# Patient Record
Sex: Female | Born: 1980 | Race: White | Hispanic: No | Marital: Married | Smoking: Never smoker
Health system: Southern US, Community
[De-identification: ages and names within clinical notes are randomized; demographics above are authoritative.]

## PROBLEM LIST (undated history)

## (undated) DIAGNOSIS — A159 Respiratory tuberculosis unspecified: Secondary | ICD-10-CM

## (undated) HISTORY — DX: Respiratory tuberculosis unspecified: A15.9

---

## 2001-02-20 ENCOUNTER — Other Ambulatory Visit: Admission: RE | Admit: 2001-02-20 | Discharge: 2001-02-20 | Payer: Self-pay | Admitting: Family Medicine

## 2005-07-22 ENCOUNTER — Other Ambulatory Visit: Admission: RE | Admit: 2005-07-22 | Discharge: 2005-07-22 | Payer: Self-pay | Admitting: Gynecology

## 2008-07-04 ENCOUNTER — Encounter: Admission: RE | Admit: 2008-07-04 | Discharge: 2008-07-04 | Payer: Self-pay | Admitting: Pulmonary Disease

## 2012-11-08 ENCOUNTER — Emergency Department (HOSPITAL_COMMUNITY): Payer: BC Managed Care – PPO

## 2012-11-08 ENCOUNTER — Emergency Department (HOSPITAL_COMMUNITY)
Admission: EM | Admit: 2012-11-08 | Discharge: 2012-11-08 | Disposition: A | Discharge status: Home / Self Care | Payer: BC Managed Care – PPO | Attending: Emergency Medicine | Admitting: Emergency Medicine

## 2012-11-08 ENCOUNTER — Encounter (HOSPITAL_COMMUNITY): Payer: Self-pay | Admitting: *Deleted

## 2012-11-08 DIAGNOSIS — R0789 Other chest pain: Secondary | ICD-10-CM

## 2012-11-08 DIAGNOSIS — R0602 Shortness of breath: Secondary | ICD-10-CM | POA: Insufficient documentation

## 2012-11-08 LAB — BASIC METABOLIC PANEL
BUN: 11 mg/dL (ref 6–23)
Calcium: 9.4 mg/dL (ref 8.4–10.5)
GFR calc Af Amer: >90 mL/min (ref >90)
GFR calc non Af Amer: >90 mL/min (ref >90)
Glucose, Bld: 91 mg/dL (ref 70–99)
Potassium: 3.9 mEq/L (ref 3.5–5.1)
Sodium: 135 mEq/L (ref 135–145)

## 2012-11-08 LAB — CBC WITH DIFFERENTIAL/PLATELET
Basophils Absolute: 0 10*3/uL (ref 0.0–0.1)
Basophils Relative: 1 % (ref 0–1)
Eosinophils Absolute: 0.1 10*3/uL (ref 0.0–0.7)
Hemoglobin: 13.1 g/dL (ref 12.0–15.0)
MCH: 32.6 pg (ref 26.0–34.0)
MCHC: 35.4 g/dL (ref 30.0–36.0)
Monocytes Relative: 6 % (ref 3–12)
Neutro Abs: 4.1 10*3/uL (ref 1.7–7.7)
Neutrophils Relative %: 64 % (ref 43–77)
Platelets: 226 10*3/uL (ref 150–400)

## 2012-11-08 MED ORDER — ALBUTEROL SULFATE HFA 108 (90 BASE) MCG/ACT IN AERS
2.0000 | INHALATION_SPRAY | RESPIRATORY_TRACT | Status: DC | PRN
  Administered 2012-11-08: 2 via RESPIRATORY_TRACT
  Filled 2012-11-08: qty 6.7

## 2012-11-08 NOTE — ED Notes (Signed)
Pt states she has been having difficulty breathing for the past week, states she "has to think about breathing". Today she developed left sided chest tightness, numbness/tingling radiates to left arm. Pain intermittent, pt states she is not in any pain right now

## 2012-11-08 NOTE — ED Provider Notes (Signed)
CSN: [id number]     Arrival date & time 11/08/12  1658 History   First MD Initiated Contact with Patient 11/08/12 1712     Chief Complaint  Patient presents with  . Shortness of Breath  . Chest Pain   (Consider location/radiation/quality/duration/timing/severity/associated sxs/prior Treatment) Patient is a 32 y.o. female presenting with shortness of breath and chest pain. The history is provided by the patient. No language interpreter was used.  Shortness of Breath Severity:  Mild Associated symptoms: chest pain   Associated symptoms: no abdominal pain, no cough, no fever and no neck pain   Associated symptoms comment:  Chest tightness, "not pain", for the past week. It began as intermittent tightness without aggravating or alleviating factors, and has progressed to become more constant today and yesterday. The pain is located in the left chest and affects the left upper arm. She states SOB described as "I have to remind myself to take a breath" and this is also unchanged with exertion. No nausea, cough, fever. She denies any long periods of travel recently, oral estrogen and she is a non-smoker. Family history negative for heart disease. Chest Pain Associated symptoms: shortness of breath   Associated symptoms: no abdominal pain, no cough, no fever, no nausea, no palpitations and no weakness     History reviewed. No pertinent past medical history. No past surgical history on file. No family history on file. History  Substance Use Topics  . Smoking status: Not on file  . Smokeless tobacco: Not on file  . Alcohol Use: Not on file   OB History   Grav Para Term Preterm Abortions TAB SAB Ect Mult Living                 Review of Systems  Constitutional: Negative for fever.  HENT: Negative for neck pain.   Respiratory: Positive for shortness of breath. Negative for cough.   Cardiovascular: Positive for chest pain. Negative for palpitations and leg swelling.  Gastrointestinal:  Negative for nausea and abdominal pain.  Genitourinary: Negative for dysuria and menstrual problem.  Musculoskeletal: Negative for myalgias.  Skin: Negative for color change and pallor.  Neurological: Negative for weakness.  Psychiatric/Behavioral: Negative for confusion.    Allergies  Review of patient's allergies indicates no known allergies.  Home Medications  No current outpatient prescriptions on file. BP 111/60  Pulse 68  Temp(Src) 98.1 F (36.7 C) (Oral)  Resp 16  SpO2 100% Physical Exam  Constitutional: She is oriented to person, place, and time. She appears well-developed and well-nourished.  HENT:  Head: Normocephalic.  Neck: Normal range of motion. Neck supple.  Cardiovascular: Normal rate and regular rhythm.   No murmur heard. Pulmonary/Chest: Effort normal and breath sounds normal. She has no wheezes. She has no rales. She exhibits no tenderness.  Abdominal: Soft. Bowel sounds are normal. There is no tenderness. There is no rebound and no guarding.  Musculoskeletal: Normal range of motion. She exhibits no edema.  Neurological: She is alert and oriented to person, place, and time.  Skin: Skin is warm and dry. No rash noted.  Psychiatric: She has a normal mood and affect.    ED Course  Procedures (including critical care time) Labs Review Labs Reviewed  TROPONIN I  BASIC METABOLIC PANEL  CBC WITH DIFFERENTIAL   Results for orders placed during the hospital encounter of 11/08/12  TROPONIN I      Result Value Range   Troponin I <0.30  <0.30 ng/mL  BASIC METABOLIC PANEL  Result Value Range   Sodium 135  135 - 145 mEq/L   Potassium 3.9  3.5 - 5.1 mEq/L   Chloride 104  96 - 112 mEq/L   CO2 24  19 - 32 mEq/L   Glucose, Bld 91  70 - 99 mg/dL   BUN 11  6 - 23 mg/dL   Creatinine, Ser 1.30  0.50 - 1.10 mg/dL   Calcium 9.4  8.4 - 86.5 mg/dL   GFR calc non Af Amer >90  >90 mL/min   GFR calc Af Amer >90  >90 mL/min  CBC WITH DIFFERENTIAL      Result Value  Range   WBC 6.4  4.0 - 10.5 K/uL   RBC 4.02  3.87 - 5.11 MIL/uL   Hemoglobin 13.1  12.0 - 15.0 g/dL   HCT 78.4  69.6 - 29.5 %   MCV 92.0  78.0 - 100.0 fL   MCH 32.6  26.0 - 34.0 pg   MCHC 35.4  30.0 - 36.0 g/dL   RDW 28.4  13.2 - 44.0 %   Platelets 226  150 - 400 K/uL   Neutrophils Relative % 64  43 - 77 %   Neutro Abs 4.1  1.7 - 7.7 K/uL   Lymphocytes Relative 28  12 - 46 %   Lymphs Abs 1.8  0.7 - 4.0 K/uL   Monocytes Relative 6  3 - 12 %   Monocytes Absolute 0.4  0.1 - 1.0 K/uL   Eosinophils Relative 1  0 - 5 %   Eosinophils Absolute 0.1  0.0 - 0.7 K/uL   Basophils Relative 1  0 - 1 %   Basophils Absolute 0.0  0.0 - 0.1 K/uL   Dg Chest 2 View  11/08/2012   *RADIOLOGY REPORT*  Clinical Data: Shortness of breath and chest pain  CHEST - 2 VIEW  Comparison: 07/04/2008  Findings: Normal heart size.  No pleural effusion or edema.  No airspace consolidation identified.  Lungs are hyperinflated but clear.  Review of the visualized osseous structures is unremarkable.  IMPRESSION:  1. Lungs are hyperinflated but clear.   Original Report Authenticated By: Signa Kell, M.D.   Imaging Review No results found.  Date: 11/08/2012  Rate: 59  Rhythm: normal sinus rhythm  QRS Axis: normal  Intervals: normal  ST/T Wave abnormalities: normal  Conduction Disutrbances:none  Narrative Interpretation:   Old EKG Reviewed: unchanged   MDM  No diagnosis found. 1. Chest tightness  Patient is NAD, normal VS, neg CXR and lab studies, PERC negative. Doubt PNA, PE, ACS. Patient is stable for discharge. Referred for PCP follow up.    Arnoldo Hooker, PA-C 11/08/12 2008  Medical screening examination/treatment/procedure(s) were performed by non-physician practitioner and as supervising physician I was immediately available for consultation/collaboration.  Enid Skeens, MD 11/09/12 [phone]

## 2012-11-08 NOTE — ED Notes (Signed)
Pt reports sob/ chest heaviness for a few days, has not been feeling well. Reports chest heaviness 3/10 and slight SOB. Able to speak in full sentences. Pt went to urgent care and urgent care sent pt to ED with EKG.

## 2012-12-22 ENCOUNTER — Encounter: Payer: Self-pay | Admitting: Family Medicine

## 2012-12-22 ENCOUNTER — Ambulatory Visit (INDEPENDENT_AMBULATORY_CARE_PROVIDER_SITE_OTHER): Payer: BC Managed Care – PPO | Admitting: Family Medicine

## 2012-12-22 VITALS — BP 108/70 | HR 86 | Temp 97.1°F | Resp 18 | Ht 64.0 in | Wt 132.0 lb

## 2012-12-22 DIAGNOSIS — F438 Other reactions to severe stress: Secondary | ICD-10-CM

## 2012-12-22 DIAGNOSIS — Z23 Encounter for immunization: Secondary | ICD-10-CM

## 2012-12-22 DIAGNOSIS — Z13220 Encounter for screening for lipoid disorders: Secondary | ICD-10-CM

## 2012-12-22 DIAGNOSIS — R0602 Shortness of breath: Secondary | ICD-10-CM

## 2012-12-22 DIAGNOSIS — F4389 Other reactions to severe stress: Secondary | ICD-10-CM

## 2012-12-22 DIAGNOSIS — F4329 Adjustment disorder with other symptoms: Secondary | ICD-10-CM

## 2012-12-22 LAB — LIPID PANEL
Cholesterol: 181 mg/dL (ref 0–200)
HDL: 71 mg/dL (ref >39)
LDL Cholesterol: 101 mg/dL — ABNORMAL HIGH (ref 0–99)
Triglycerides: 43 mg/dL (ref <150)

## 2012-12-22 MED ORDER — ALBUTEROL SULFATE HFA 108 (90 BASE) MCG/ACT IN AERS: 2.0000 | INHALATION_SPRAY | Freq: Four times a day (QID) | RESPIRATORY_TRACT | Status: DC | PRN

## 2012-12-22 NOTE — Assessment & Plan Note (Signed)
Check FLP today. 

## 2012-12-22 NOTE — Assessment & Plan Note (Signed)
Per above I think her episode was due to stress in some form FOr now we will monitor symptoms No anxiety meds were prescribed

## 2012-12-22 NOTE — Progress Notes (Signed)
  Subjective:    Patient ID: Lynn Escobar, female    DOB: 09-Feb-1981, 32 y.o.   MRN: [id number]  HPI  Pt here to establish care, no previous PCP GYN- [facility]- [facility] Due for Flu and TDAP Medications and history reviewed  Patient here with shortness of breath. She was evaluated in the ER back in September after an acute episode of shortness of breath and chest tightness with some tingling in her left arm. She was transferred from the urgent care after her normal EKG for further workup. She had a negative troponin negative CBC and metabolic panel are monitoring was also normal. Her chest x-ray was also normal. She was given albuterol inhaler which she has used twice when she fell her chest becoming tight and it helped relieve the symptoms very quickly. Her last episode occurred [place] however she did have some nasal congestion at the time and she used the inhaler one time. Denies any cough or wheezing. She denies any true shortness of breath this has a heaviness in her chest.   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 She is also at home with her husband and home life is good they are trying to conceive which they've been doing for the past month. She exercises on a regular basis and eats very well. She's not had any crying spells or feelings of depression. She has no history of depression or anxiety.  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, she was treated for 9 months with medications, has had negative chest xray   Review of Systems - per above  GEN- denies fatigue, fever, weight loss,weakness, recent illness HEENT- denies eye drainage, change in vision, nasal discharge, CVS- denies chest pain, palpitations RESP- denies SOB, cough, wheeze ABD- denies N/V, change  in stools, abd pain GU- denies dysuria, hematuria, dribbling, incontinence MSK- denies joint pain, muscle aches, injury Neuro- denies headache, dizziness, syncope, seizure activity      Objective:   Physical Exam GEN- NAD, alert and oriented x3 HEENT- PERRL, EOMI, non injected sclera, pink conjunctiva, MMM, oropharynx clear Neck- Supple, no thyromegaly CVS- RRR, no murmur RESP-CTAB EXT- No edema Pulses- Radial, DP 2+ Psych- normal affect and mood        Assessment & Plan:

## 2012-12-22 NOTE — Assessment & Plan Note (Signed)
It appears that hers episode a month ago was most likely related to stress and some form. I highly doubt anything cardiac based on her history and her workup at the hospital. Overall she has not given me the feeling of high anxiety even though she does have a very demanding profession. Regarding the albuterol use I think this is more mental when she does he is she did have one occasion which she did have an upper respiratory infection and helps. Advise her to only use if she has coughing or wheezing otherwise if she has a feeling of the chest tightness try to breathe through and see if the symptoms resolve without the use of the inhaler. She is only had one other true episodes since her ER visit. She has no risk factors for pulmonary disease as well however she continues to use the albuterol inhaler we will get pulmonary function testing set up.

## 2012-12-22 NOTE — Patient Instructions (Signed)
Use the albuterol as needed Call if anything worsens or you have more episodes Cholesterol screen today Tetanus and Flu shot give F/U as needed

## 2013-05-07 ENCOUNTER — Ambulatory Visit (INDEPENDENT_AMBULATORY_CARE_PROVIDER_SITE_OTHER): Payer: BC Managed Care – PPO | Admitting: Family Medicine

## 2013-05-07 ENCOUNTER — Encounter: Payer: Self-pay | Admitting: Family Medicine

## 2013-05-07 VITALS — BP 118/58 | HR 68 | Temp 99.1°F | Resp 14 | Ht 65.0 in | Wt 133.0 lb

## 2013-05-07 DIAGNOSIS — R0602 Shortness of breath: Secondary | ICD-10-CM

## 2013-05-07 DIAGNOSIS — R002 Palpitations: Secondary | ICD-10-CM

## 2013-05-07 MED ORDER — ALBUTEROL SULFATE HFA 108 (90 BASE) MCG/ACT IN AERS: 2.0000 | INHALATION_SPRAY | Freq: Four times a day (QID) | RESPIRATORY_TRACT | Status: AC | PRN

## 2013-05-07 MED ORDER — LORAZEPAM 0.5 MG PO TABS: 0.5000 mg | ORAL_TABLET | Freq: Three times a day (TID) | ORAL | Status: DC

## 2013-05-07 NOTE — Patient Instructions (Signed)
Referral to cardiology Holter monitor Try the ativan  F/U as needed

## 2013-05-07 NOTE — Progress Notes (Signed)
Patient ID: Lynn MessingJulia Leite, female   DOB: 06/07/1980, 33 y.o.   MRN: [id number]   Subjective:    Patient ID: Lynn MessingJulia Luft, female    DOB: 03/31/1980, 33 y.o.   MRN: [id number]  Patient presents for F/U  Patient presents for followup on shortness of breath and fluttering episodes. She was seen at establishing visit back in October 2014 she had recently been in the had a shortness of breath and chest palpitations. He had EKG chest x-ray last monitoring in the emergency room which were all normal. She's also had her thyroid done which was normal. She was given an albuterol inhaler she used a few times which helps sometimes. She has not felt overly anxious. She has no known cardiac disease in her family. She is a nonsmoker and exercise on a regular basis without any symptoms. She been symptom free until last week where she had 4 days there were no of shortness of breath associated with fluttering in her chest up into her throat. She did not have any true palpitations or chest pain. She did try her inhaler but it does not help. Of note the episodes will last a few hours but when she was able to calm him down she was fine. At times she does find that her heart rate is going very fast towards the end of the day when she is supposed to be getting ready for bed. 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 She's preparing to leave out of town is concerned about her increased symptoms   Review Of Systems:  GEN- denies fatigue, fever, weight loss,weakness, recent illness HEENT- denies eye drainage, change in vision, nasal discharge, CVS- denies chest pain,+ palpitations RESP-+ SOB, cough, wheeze ABD- denies N/V, change in stools, abd pain GU- denies dysuria, hematuria, dribbling, incontinence MSK- denies joint pain, muscle aches, injury Neuro- denies headache, dizziness, syncope, seizure activity       Objective:    BP 118/58  Pulse 68   Temp(Src) 99.1 F (37.3 C)  Resp 14  Ht 5\' 5"  (1.651 m)  Wt 133 lb (60.328 kg)  BMI 22.13 kg/m2  LMP 04/18/2013 GEN- NAD, alert and oriented x3 HEENT- PERRL, EOMI, non injected sclera, pink conjunctiva, MMM, oropharynx clear Neck- Supple, no thyromegaly CVS- RRR, no murmur RESP-CTAB ABD-NABS,soft,NT,ND EXT- No edema Pulses- Radial 2+ Psych- normal affect and mood        Assessment & Plan:      Problem List Items Addressed This Visit   None    Visit Diagnoses   SOB (shortness of breath) on exertion    -  Primary    Relevant Orders       Holter monitor - 24 hour       Note: This dictation was prepared with Dragon dictation along with smaller phrase technology. Any transcriptional errors that result from this process are unintentional.

## 2013-05-09 DIAGNOSIS — R002 Palpitations: Secondary | ICD-10-CM | POA: Insufficient documentation

## 2013-05-09 NOTE — Assessment & Plan Note (Signed)
Per above 

## 2013-05-09 NOTE — Assessment & Plan Note (Signed)
I do not see any evidence of respiratory disease. Her chest x-ray was negative her exam is always benign. I. There is something cardiac though it appears that would show up when she is exercising or otherwise in her routine when she is stressed and it does not. Anxiety is also on the list as she does have a very highly stressful job. She had symptoms recently which have been almost daily I have given her the 24 hour monitor in our office to see we capture anything this weekend. I will also get her set up with cardiology before she leaves to go out of town and have them weigh in. She does not have any strong family cardiac history. I've also given her some Ativan to try to avoid these episodes to see if his anxiety. 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

## 2013-05-13 ENCOUNTER — Ambulatory Visit (INDEPENDENT_AMBULATORY_CARE_PROVIDER_SITE_OTHER): Payer: BC Managed Care – PPO | Admitting: Cardiovascular Disease

## 2013-05-13 ENCOUNTER — Encounter: Payer: Self-pay | Admitting: Cardiovascular Disease

## 2013-05-13 VITALS — BP 106/58 | HR 59 | Resp 16 | Ht 65.0 in | Wt 132.3 lb

## 2013-05-13 DIAGNOSIS — R002 Palpitations: Secondary | ICD-10-CM

## 2013-05-13 DIAGNOSIS — R0602 Shortness of breath: Secondary | ICD-10-CM

## 2013-05-13 NOTE — Patient Instructions (Signed)
Your physician has requested that you have an echocardiogram. Echocardiography is a painless test that uses sound waves to create images of your heart. It provides your doctor with information about the size and shape of your heart and how well your heart's chambers and valves are working. This procedure takes approximately one hour. There are no restrictions for this procedure.  We will call you with the results.  Your physician recommends that you schedule a follow-up appointment in: AS NEEDED  

## 2013-05-16 ENCOUNTER — Encounter: Payer: Self-pay | Admitting: Cardiovascular Disease

## 2013-05-16 NOTE — Progress Notes (Signed)
Patient ID: Lynn Escobar, female   DOB: Oct 03, 1980, 33 y.o.   MRN: [id number]     Reason for office visit Chest pain and palpitations  Lynn Escobar is a 33 year old woman who presents for evaluation of palpitations and chest discomfort. Last September she was seen in the emergency room for extremely severe chest pain and shortness of breath. She felt as if somebody was sitting in her chest. Her diagnostic cup was negative. Electrocardiograms and cardiac enzymes and CXR were normal. The symptoms occurred at rest and were associated with palpitations. She generally felt well for a couple of weeks ago when she began having a sensation of fluttering in her upper chest/lower throat and then feeling of inability to take a deep breath. She has worn a 24-hour Holter monitor but this was just taken off yesterday and I cannot find either the tracings or interpretation yet.  She is fit. She runs 5 days a week for 30-45 minutes the treadmill at 5.5 miles per hour. She denies any problems with shortness of breath or chest discomfort while she runs. If she has palpitations, these go away when she gets on the treadmill. She drinks a fairly large amount of coffee.  Has a history of TB exposure of which has been treated. No other medical problems. 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 She does not smoke and drinks a glass of wine a few evenings a week.   No Known Allergies  Current Outpatient Prescriptions  Medication Sig Dispense Refill  . albuterol (PROVENTIL HFA;VENTOLIN HFA) 108 (90 BASE) MCG/ACT inhaler Inhale 2 puffs into the lungs every 6 (six) hours as needed for wheezing.  1 Inhaler  1  . LORazepam (ATIVAN) 0.5 MG tablet Take 0.5 mg by mouth every 8 (eight) hours as needed.      . Prenatal Multivit-Min-Fe-FA (PRENATAL VITAMINS PO) Take by mouth.       No current facility-administered medications for this visit.    Past Medical History  Diagnosis Date  . Tuberculosis     2010     No past surgical history on file.  Family History  Problem Relation Age of Onset  . Cancer Mother 1    breast  . Alcohol abuse Maternal Grandfather   . Asthma Sister     History   Social History  . Marital Status: Married    Spouse Name: N/A    Number of Children: N/A  . Years of Education: N/A   Occupational History  . Not on file.   Social History Main Topics  . Smoking status: Never Smoker   . Smokeless tobacco: Never Used  . Alcohol Use: Yes     Comment: rare  . Drug Use: No  . Sexual Activity: Not on file   Other Topics Concern  . Not on file   Social History Narrative  . No narrative on file    Review of systems: The patient specifically denies any recent chest pain at rest or with exertion, recent dyspnea at rest or with exertion, orthopnea, paroxysmal nocturnal dyspnea, syncope, focal neurological deficits, intermittent claudication, lower extremity edema, unexplained weight gain, cough, hemoptysis or wheezing.  The patient also denies abdominal pain, nausea, vomiting, dysphagia, diarrhea, constipation, polyuria, polydipsia, dysuria, hematuria, frequency, urgency, abnormal bleeding or bruising, fever, chills, unexpected weight changes, mood swings, change in skin or hair texture, change in voice quality, auditory or visual problems, allergic reactions or rashes, new musculoskeletal complaints other than usual "aches and pains".   PHYSICAL EXAM  BP 106/58  Pulse 59  Resp 16  Ht 5\' 5"  (1.651 m)  Wt 60.011 kg (132 lb 4.8 oz)  BMI 22.02 kg/m2  LMP 04/18/2013  General: Alert, oriented x3, no distress Head: no evidence of trauma, PERRL, EOMI, no exophtalmos or lid lag, no myxedema, no xanthelasma; normal ears, nose and oropharynx Neck: normal jugular venous pulsations and no hepatojugular reflux; brisk carotid pulses without delay and no carotid bruits Chest: clear to auscultation, no signs of consolidation by percussion or palpation, normal fremitus,  symmetrical and full respiratory excursions Cardiovascular: normal position and quality of the apical impulse, regular rhythm, normal first and second heart sounds, no murmurs, rubs or gallops. She has a fairly distinct systolic click and I think it becomes more prominent with a Valsalva maneuver Abdomen: no tenderness or distention, no masses by palpation, no abnormal pulsatility or arterial bruits, normal bowel sounds, no hepatosplenomegaly Extremities: no clubbing, cyanosis or edema; 2+ radial, ulnar and brachial pulses bilaterally; 2+ right femoral, posterior tibial and dorsalis pedis pulses; 2+ left femoral, posterior tibial and dorsalis pedis pulses; no subclavian or femoral bruits Neurological: grossly nonfocal   EKG: Sinus bradycardia, normal  Lipid Panel     Component Value Date/Time   CHOL 181 12/22/2012 0906   TRIG 43 12/22/2012 0906   HDL 71 12/22/2012 0906   CHOLHDL 2.5 12/22/2012 0906   VLDL 9 12/22/2012 0906   LDLCALC 101* 12/22/2012 0906    BMET    Component Value Date/Time   NA 135 11/08/2012 1855   K 3.9 11/08/2012 1855   CL 104 11/08/2012 1855   CO2 24 11/08/2012 1855   GLUCOSE 91 11/08/2012 1855   BUN 11 11/08/2012 1855   CREATININE 0.68 11/08/2012 1855   CALCIUM 9.4 11/08/2012 1855   GFRNONAA >90 11/08/2012 1855   GFRAA >90 11/08/2012 1855     ASSESSMENT AND PLAN  The sounds that Lynn Escobar has symptomatic arrhythmia, most likely isolated PACs and PVCs. Hopefully the Holter monitor will be revealing that she was essentially asymptomatic while wearing it. Her physical exam suggests possible mitral valve prolapse and I have recommended an echocardiogram. I don't hear a murmur and doubt that she has significant mitral insufficiency. She is very active and asymptomatic with exertion so it is very unlikely that she has coronary disease or other significant structural abnormalities.  We'll review her echo and holter monitor and touch base with her by phone. If there are no  high-risk features I don't think there will be a need for further diagnostic cardiac evaluation.  Patient Instructions  Your physician has requested that you have an echocardiogram. Echocardiography is a painless test that uses sound waves to create images of your heart. It provides your doctor with information about the size and shape of your heart and how well your heart's chambers and valves are working. This procedure takes approximately one hour. There are no restrictions for this procedure.  We will call you with the results.  Your physician recommends that you schedule a follow-up appointment in: AS NEEDED.      Orders Placed This Encounter  Procedures  . EKG 12-Lead  . 2D Echocardiogram without contrast   Meds ordered this encounter  Medications  . LORazepam (ATIVAN) 0.5 MG tablet    Sig: Take 0.5 mg by mouth every 8 (eight) hours as needed.    Junious SilkROITORU,Shyler Hamill  Jadie Comas, MD, [facility] [phone] office [phone] pager

## 2013-05-18 ENCOUNTER — Telehealth: Payer: Self-pay | Admitting: Family Medicine

## 2013-05-18 NOTE — Telephone Encounter (Signed)
Call pt, her holter monitor shows some PVC which are benign, she did have some times where her heart rate was a slow. I am faxing this to the cardiologist so he can review with her other cardiac test.

## 2013-05-19 NOTE — Telephone Encounter (Signed)
LMTRC

## 2013-05-20 NOTE — Telephone Encounter (Signed)
Call placed to patient and patient made aware.  

## 2013-05-21 ENCOUNTER — Telehealth: Payer: Self-pay | Admitting: *Deleted

## 2013-05-21 NOTE — Telephone Encounter (Signed)
Message copied by Vita BarleyLASSITER, Noni Stonesifer A on Fri May 21, 2013  5:22 PM ------      Message from: WesleyROITORU, [place]      Created: Fri May 21, 2013 10:42 AM       Her monitor showed PVCs that happen mostly at night and do not require further investigation or treatment ------

## 2013-05-21 NOTE — Telephone Encounter (Signed)
Holter results called to patient.  Informed infrequent PVC's mostly at night.  She is asymptomatic.  She will not keep appt Tuesday.

## 2013-05-21 NOTE — Telephone Encounter (Signed)
Message copied by Vita BarleyLASSITER, Antwoine Zorn A on Fri May 21, 2013  5:25 PM ------      Message from: Thurmon FairROITORU, MIHAI      Created: Fri May 21, 2013 10:42 AM       Her monitor showed PVCs that happen mostly at night and do not require further investigation or treatment ------

## 2013-05-25 ENCOUNTER — Ambulatory Visit (HOSPITAL_COMMUNITY): Payer: BC Managed Care – PPO

## 2013-06-02 ENCOUNTER — Encounter: Payer: Self-pay | Admitting: Family Medicine

## 2013-06-04 ENCOUNTER — Telehealth (HOSPITAL_COMMUNITY): Payer: Self-pay | Admitting: *Deleted

## 2013-06-09 ENCOUNTER — Telehealth (HOSPITAL_COMMUNITY): Payer: Self-pay | Admitting: *Deleted

## 2014-04-21 IMAGING — CR DG CHEST 2V
2 series · 2 of 2 positions shown · non-contrast
Comparison: 07/04/2008

CLINICAL DATA: Shortness of breath and chest pain

CHEST - 2 VIEW

[w chest pa]
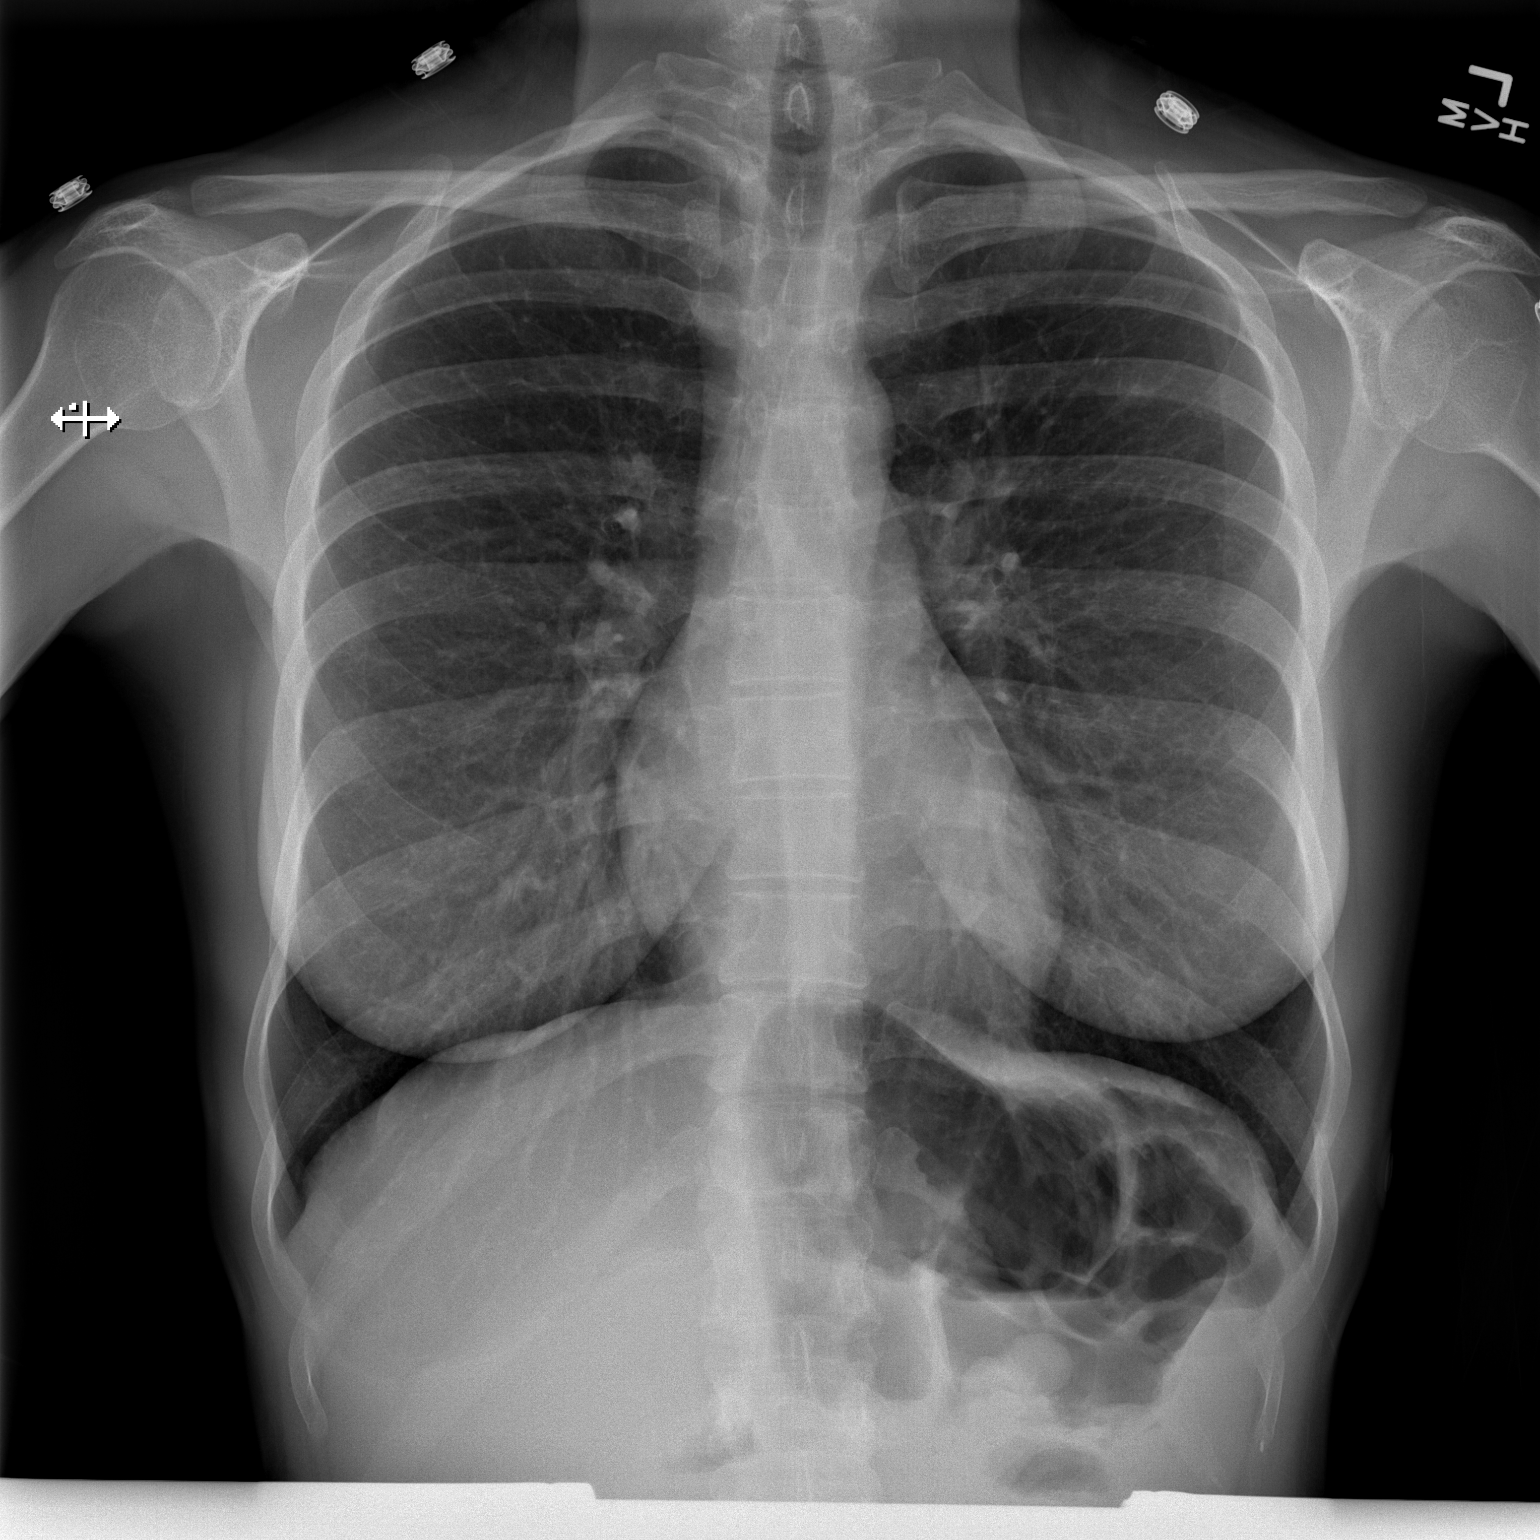

[w chest lat]
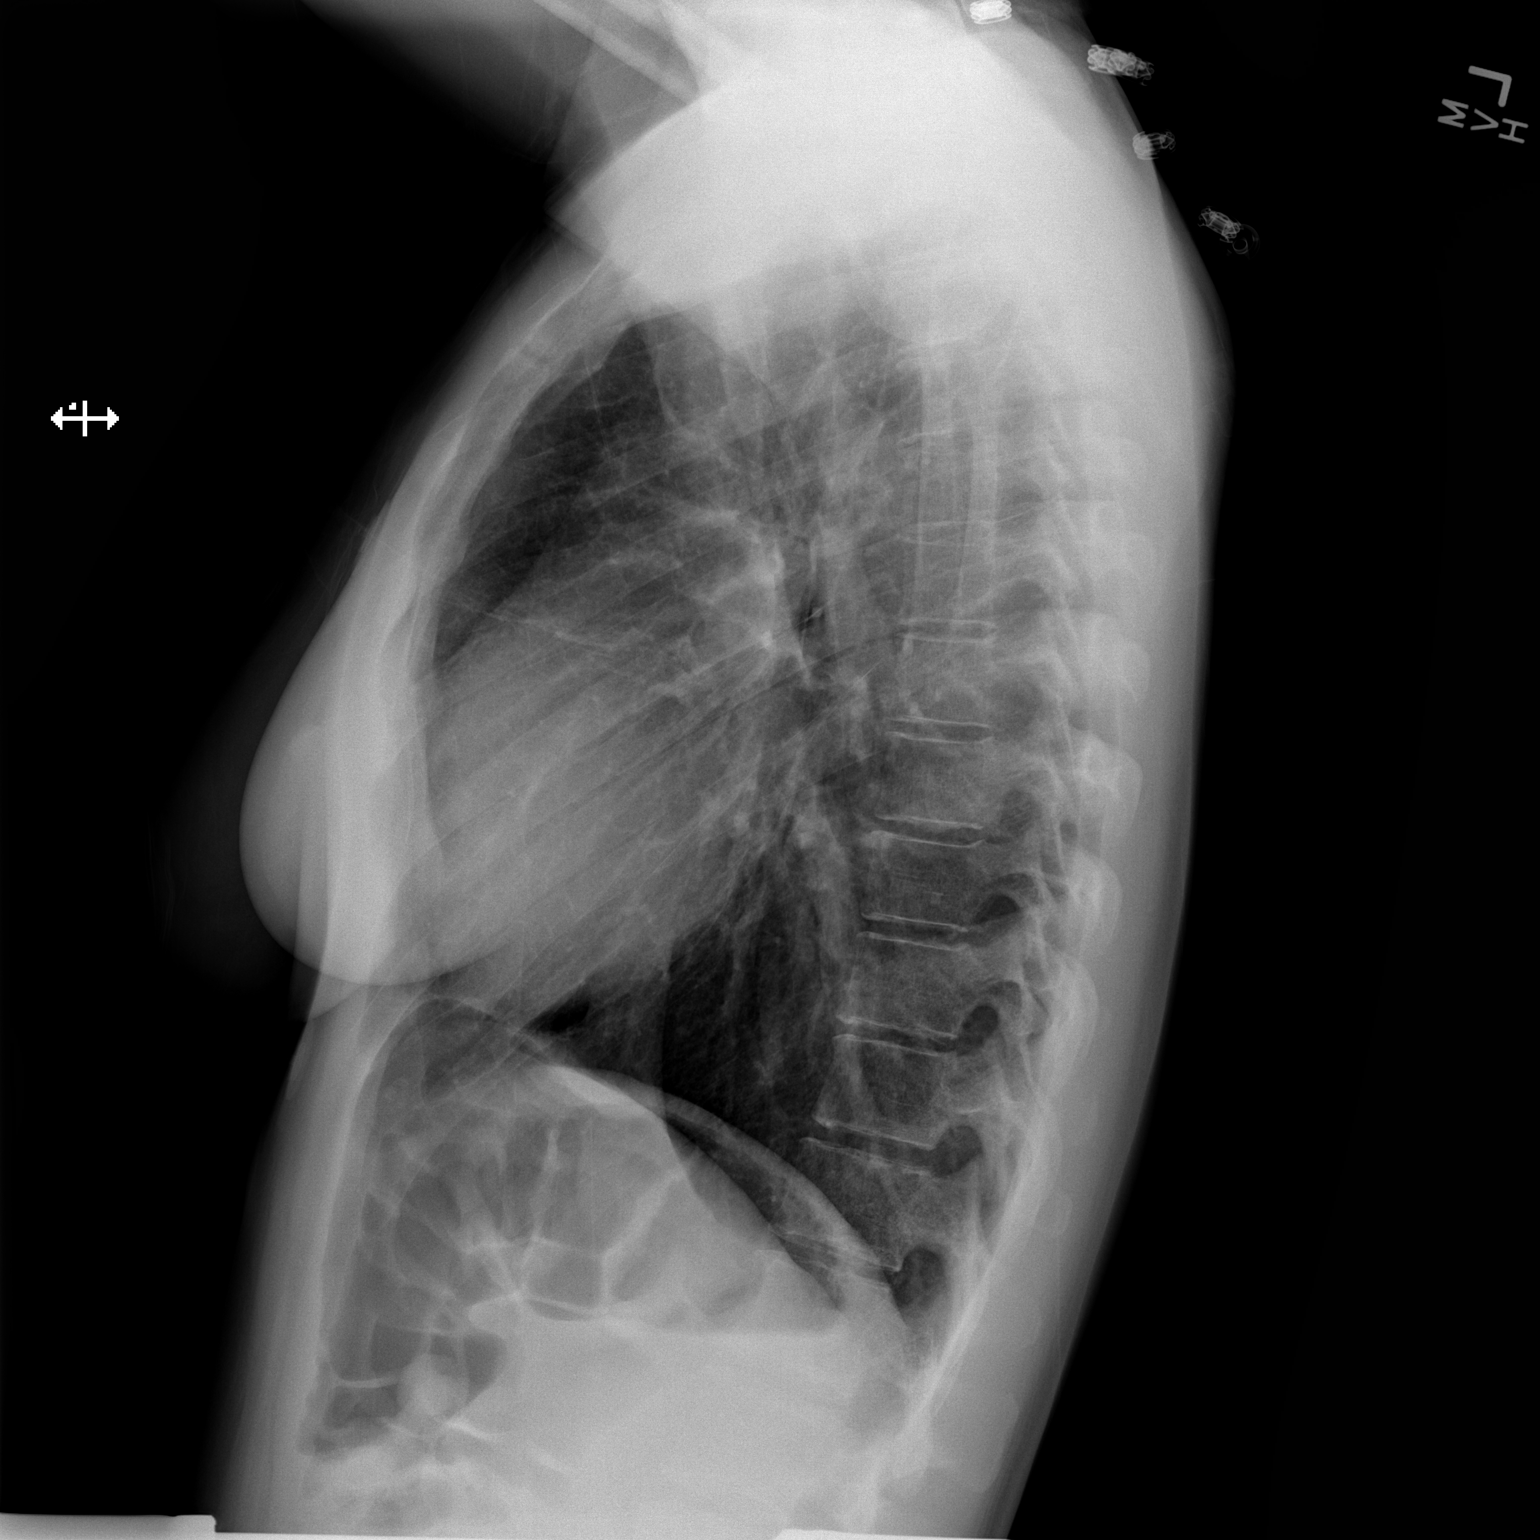

[2 of 2 positions shown; findings below may reference images not displayed]

FINDINGS: Normal heart size.  No pleural effusion or edema.  No
airspace consolidation identified.  Lungs are hyperinflated but
clear.

Review of the visualized osseous structures is unremarkable.
IMPRESSION: 1. Lungs are hyperinflated but clear.

## 2015-05-30 ENCOUNTER — Encounter: Payer: Self-pay | Admitting: Cardiovascular Disease

## 2015-05-30 ENCOUNTER — Telehealth: Payer: Self-pay | Admitting: Cardiovascular Disease

## 2015-05-30 NOTE — Telephone Encounter (Signed)
Letter in Epic, addressed to her primary care physician

## 2015-05-30 NOTE — Telephone Encounter (Signed)
Acknowledged, and communicated w/ patient.

## 2015-05-30 NOTE — Telephone Encounter (Signed)
Please call.she need a note for her life insurance company,stating that she needs no further treatment and date when she was discharged. Please fax to her at [phone] YNW:GNFAOAtt:Julie.

## 2018-11-25 ENCOUNTER — Other Ambulatory Visit: Payer: Self-pay | Admitting: Obstetrics and Gynecology

## 2018-11-25 DIAGNOSIS — Z9189 Other specified personal risk factors, not elsewhere classified: Secondary | ICD-10-CM

## 2020-01-13 ENCOUNTER — Other Ambulatory Visit: Payer: Self-pay | Admitting: Obstetrics and Gynecology

## 2020-01-13 DIAGNOSIS — Z9189 Other specified personal risk factors, not elsewhere classified: Secondary | ICD-10-CM

## 2021-04-11 ENCOUNTER — Other Ambulatory Visit: Payer: Self-pay | Admitting: Obstetrics and Gynecology

## 2021-04-11 DIAGNOSIS — Z9189 Other specified personal risk factors, not elsewhere classified: Secondary | ICD-10-CM

## 2022-05-06 ENCOUNTER — Other Ambulatory Visit: Payer: Self-pay | Admitting: Obstetrics and Gynecology

## 2022-05-06 DIAGNOSIS — Z1239 Encounter for other screening for malignant neoplasm of breast: Secondary | ICD-10-CM

## 2022-11-12 ENCOUNTER — Ambulatory Visit
Admission: RE | Admit: 2022-11-12 | Discharge: 2022-11-12 | Discharge status: Home / Self Care | Payer: BLUE CROSS/BLUE SHIELD | Source: Ambulatory Visit | Attending: Obstetrics and Gynecology | Admitting: Obstetrics and Gynecology

## 2022-11-12 DIAGNOSIS — Z1239 Encounter for other screening for malignant neoplasm of breast: Secondary | ICD-10-CM

## 2022-11-12 MED ORDER — GADOPICLENOL 0.5 MMOL/ML IV SOLN
6.0000 mL | Freq: Once | INTRAVENOUS | Status: AC | PRN
  Administered 2022-11-12: 6 mL via INTRAVENOUS
